# Patient Record
Sex: Female | Born: 1950 | Race: White | Hispanic: No | Marital: Married | Smoking: Never smoker
Health system: Southern US, Community
[De-identification: ages and names within clinical notes are randomized; demographics above are authoritative.]

## PROBLEM LIST (undated history)

## (undated) DIAGNOSIS — G20A1 Parkinson's disease without dyskinesia, without mention of fluctuations: Secondary | ICD-10-CM

## (undated) DIAGNOSIS — G20 Parkinson's disease: Secondary | ICD-10-CM

## (undated) HISTORY — PX: ABDOMINAL HYSTERECTOMY: SHX81

## (undated) HISTORY — PX: TONSILLECTOMY: SUR1361

## (undated) HISTORY — PX: BACK SURGERY: SHX140

---

## 2019-08-14 ENCOUNTER — Other Ambulatory Visit: Payer: Self-pay

## 2019-08-14 ENCOUNTER — Emergency Department (HOSPITAL_BASED_OUTPATIENT_CLINIC_OR_DEPARTMENT_OTHER): Payer: Medicare Other

## 2019-08-14 ENCOUNTER — Emergency Department (HOSPITAL_BASED_OUTPATIENT_CLINIC_OR_DEPARTMENT_OTHER)
Admission: EM | Admit: 2019-08-14 | Discharge: 2019-08-14 | Disposition: A | Discharge status: Home / Self Care | Payer: Medicare Other | Attending: Emergency Medicine | Admitting: Emergency Medicine

## 2019-08-14 ENCOUNTER — Encounter (HOSPITAL_BASED_OUTPATIENT_CLINIC_OR_DEPARTMENT_OTHER): Payer: Self-pay | Admitting: *Deleted

## 2019-08-14 DIAGNOSIS — Z79899 Other long term (current) drug therapy: Secondary | ICD-10-CM | POA: Diagnosis not present

## 2019-08-14 DIAGNOSIS — Y92828 Other wilderness area as the place of occurrence of the external cause: Secondary | ICD-10-CM | POA: Diagnosis not present

## 2019-08-14 DIAGNOSIS — W458XXA Other foreign body or object entering through skin, initial encounter: Secondary | ICD-10-CM | POA: Insufficient documentation

## 2019-08-14 DIAGNOSIS — Z23 Encounter for immunization: Secondary | ICD-10-CM | POA: Diagnosis not present

## 2019-08-14 DIAGNOSIS — S81811A Laceration without foreign body, right lower leg, initial encounter: Secondary | ICD-10-CM | POA: Insufficient documentation

## 2019-08-14 DIAGNOSIS — Y999 Unspecified external cause status: Secondary | ICD-10-CM | POA: Diagnosis not present

## 2019-08-14 DIAGNOSIS — Y9301 Activity, walking, marching and hiking: Secondary | ICD-10-CM | POA: Diagnosis not present

## 2019-08-14 DIAGNOSIS — G20 Parkinson's disease: Secondary | ICD-10-CM | POA: Diagnosis not present

## 2019-08-14 HISTORY — DX: Parkinson's disease: G20

## 2019-08-14 HISTORY — DX: Parkinson's disease without dyskinesia, without mention of fluctuations: G20.A1

## 2019-08-14 MED ORDER — FENTANYL CITRATE (PF) 100 MCG/2ML IJ SOLN
50.0000 ug | Freq: Once | INTRAMUSCULAR | Status: AC
  Administered 2019-08-14: 16:00:00 50 ug via INTRAVENOUS
  Filled 2019-08-14: qty 2

## 2019-08-14 MED ORDER — CEFAZOLIN SODIUM-DEXTROSE 1-4 GM/50ML-% IV SOLN
1.0000 g | Freq: Once | INTRAVENOUS | Status: DC
  Filled 2019-08-14: qty 50

## 2019-08-14 MED ORDER — SODIUM CHLORIDE 0.9 % IV SOLN
INTRAVENOUS | Status: DC | PRN
  Administered 2019-08-14: 250 mL via INTRAVENOUS

## 2019-08-14 MED ORDER — BUPIVACAINE HCL 0.5 % IJ SOLN
50.0000 mL | Freq: Once | INTRAMUSCULAR | Status: AC
  Administered 2019-08-14: 16:00:00 50 mL
  Filled 2019-08-14: qty 1

## 2019-08-14 MED ORDER — CEFAZOLIN SODIUM-DEXTROSE 2-4 GM/100ML-% IV SOLN
2.0000 g | Freq: Once | INTRAVENOUS | Status: AC
  Administered 2019-08-14: 17:00:00 2 g via INTRAVENOUS
  Filled 2019-08-14: qty 100

## 2019-08-14 MED ORDER — TETANUS-DIPHTH-ACELL PERTUSSIS 5-2.5-18.5 LF-MCG/0.5 IM SUSP
0.5000 mL | Freq: Once | INTRAMUSCULAR | Status: AC
  Administered 2019-08-14: 0.5 mL via INTRAMUSCULAR
  Filled 2019-08-14: qty 0.5

## 2019-08-14 MED ORDER — ONDANSETRON HCL 4 MG/2ML IJ SOLN
4.0000 mg | Freq: Once | INTRAMUSCULAR | Status: AC
  Administered 2019-08-14: 16:00:00 4 mg via INTRAVENOUS
  Filled 2019-08-14: qty 2

## 2019-08-14 NOTE — ED Provider Notes (Signed)
[name] Provider Note   CSN: [id number] Arrival date & time: 08/14/19  1344     History Chief Complaint  Patient presents with  . Laceration    Christina Pratt is a 69 y.o. female.  HPI      Christina Pratt is a 69 y.o. female, with a history of Parkinson's disease, presenting to the ED with right lower leg injury that occurred late morning today. Patient was walking in an area with freshly downed trees.  A cut off stick impaled her leg as she was walking.  She was seen at urgent care earlier today and then sent to the emergency department for further management. She is unsure of her last tetanus update.  Denies numbness, weakness, other injuries.  Past Medical History:  Diagnosis Date  . Parkinson disease ([place])     There are no problems to display for this patient.   Past Surgical History:  Procedure Laterality Date  . ABDOMINAL HYSTERECTOMY    . BACK SURGERY    . TONSILLECTOMY       OB History   No obstetric history on file.     No family history on file.  Social History   Tobacco Use  . Smoking status: Never Smoker  . Smokeless tobacco: Never Used  Substance Use Topics  . Alcohol use: Never  . Drug use: Never    Home Medications Prior to Admission medications   Medication Sig Start Date End Date Taking? Authorizing Provider  Carbidopa-Levodopa ER (SINEMET CR) 25-100 MG tablet controlled release Take by mouth. 05/02/19  Yes [provider]  clonazePAM (KLONOPIN) 0.5 MG tablet Take by mouth. 08/02/19  Yes [provider]  venlafaxine XR (EFFEXOR-XR) 75 MG 24 hr capsule Take by mouth. 08/02/19  Yes [provider]    Allergies    Patient has no known allergies.  Review of Systems   Review of Systems  Skin: Positive for wound.  Neurological: Negative for weakness and numbness.    Physical Exam Updated Vital Signs BP (!) 164/87   Pulse 80   Temp 98.2 F (36.8 C) (Oral)   Resp 18    Ht 5\' 5"  (1.651 m)   Wt 72.6 kg   SpO2 98%   BMI 26.63 kg/m   Physical Exam Vitals and nursing note reviewed.  Constitutional:      General: She is not in acute distress.    Appearance: She is well-developed. She is not diaphoretic.  HENT:     Head: Normocephalic and atraumatic.  Eyes:     Conjunctiva/sclera: Conjunctivae normal.  Cardiovascular:     Rate and Rhythm: Normal rate and regular rhythm.     Pulses:          Dorsalis pedis pulses are 2+ on the right side.       Posterior tibial pulses are 2+ on the right side.  Pulmonary:     Effort: Pulmonary effort is normal.  Musculoskeletal:     Cervical back: Neck supple.     Comments: 18 cm T-shaped laceration to the right lower leg, as shown. At depth, noted exposure of what appears to be fascia without noted violation. Full ROM in the patient's ankle and knee.  Skin:    General: Skin is warm and dry.     Capillary Refill: Capillary refill takes less than 2 seconds.     Coloration: Skin is not pale.  Neurological:     Mental Status: She is alert.  Comments: Sensation light touch grossly intact in the right lower extremity, including into the foot. Strength 5/5 in the right ankle and knee.  Psychiatric:        Behavior: Behavior normal.                      ED Results / Procedures / Treatments   Labs (all labs ordered are listed, but only abnormal results are displayed) Labs Reviewed - No data to display  EKG None  Radiology DG Tibia/Fibula Right  Result Date: 08/14/2019 CLINICAL DATA:  Laceration to lower leg EXAM: RIGHT TIBIA AND FIBULA - 2 VIEW COMPARISON:  None. FINDINGS: No fracture or malalignment. Large soft tissue laceration at the mid to distal lower leg without radiopaque foreign body. IMPRESSION: No acute osseous abnormality. Electronically Signed   By: Jasmine Pang M.D.   On: 08/14/2019 15:40    Procedures .Marland KitchenLaceration Repair  Date/Time: 08/14/2019 5:45 PM Performed by: Anselm Pancoast, PA-C Authorized by: Anselm Pancoast, PA-C   Consent:    Consent obtained:  Verbal   Consent given by:  Patient   Risks discussed:  Infection, need for additional repair, pain, poor cosmetic result, poor wound healing, retained foreign body, vascular damage, tendon damage and nerve damage Anesthesia (see MAR for exact dosages):    Anesthesia method:  Local infiltration   Local anesthetic:  Bupivacaine 0.5% w/o epi Laceration details:    Location:  Leg   Leg location:  R lower leg   Length (cm):  18 Repair type:    Repair type:  Complex Pre-procedure details:    Preparation:  Patient was prepped and draped in usual sterile fashion and imaging obtained to evaluate for foreign bodies Treatment:    Area cleansed with:  Betadine and saline   Amount of cleaning:  Extensive   Irrigation solution:  Sterile saline   Irrigation volume:  2L   Irrigation method:  Pressure wash   Debridement:  Minimal   Undermining:  None Skin repair:    Repair method:  Sutures   Suture size:  3-0   Suture material:  Prolene   Suture technique:  Simple interrupted   Number of sutures:  23 Approximation:    Approximation:  Loose Post-procedure details:    Dressing:  Non-adherent dressing, sterile dressing and bulky dressing   Patient tolerance of procedure:  Tolerated well, no immediate complications   (including critical care time)  Medications Ordered in ED Medications  Tdap (BOOSTRIX) injection 0.5 mL (0.5 mLs Intramuscular Given 08/14/19 1554)  fentaNYL (SUBLIMAZE) injection 50 mcg (50 mcg Intravenous Given 08/14/19 1551)  ondansetron (ZOFRAN) injection 4 mg (4 mg Intravenous Given 08/14/19 1551)  bupivacaine (MARCAINE) 0.5 % (with pres) injection 50 mL (50 mLs Infiltration Given 08/14/19 1606)  ceFAZolin (ANCEF) IVPB 2g/100 mL premix ( Intravenous Stopped 08/14/19 1758)    ED Course  I have reviewed the triage vital signs and the nursing notes.  Pertinent labs & imaging results that were available  during my care of the patient were reviewed by me and considered in my medical decision making (see chart for details).  Clinical Course as of Aug 14 1256  Mon Aug 14, 2019  1710 Spoke with Dr. Victorino Dike, orthopedic surgeon.  2 g Ancef here in ED. No PO ABX for home.  Have patient call the office tomorrow to set up an appointment to be seen on Wednesday.  If necessary, they will take the patient to the OR Thursday.   [  SJ]    Clinical Course User Index [SJ] Evora Schechter, Hillard Danker, PA-C   MDM Rules/Calculators/A&P                      Patient presents with large laceration to the right lower leg.  No noted evidence of neurovascular compromise. I personally reviewed and interpreted the patient's imaging study.  No noted osseous abnormalities or evidence of foreign bodies. Wound was thoroughly irrigated and loosely closed.  She will have close follow-up with orthopedics. The patient was given instructions for home care as well as return precautions. Patient voices understanding of these instructions, accepts the plan, and is comfortable with discharge.    Findings and plan of care discussed with Cathi Roan, MD. Dr. Jacqulyn Bath and I reviewed the pictures together.   Final Clinical Impression(s) / ED Diagnoses Final diagnoses:  Laceration of right lower extremity, initial encounter    Rx / DC Orders ED Discharge Orders    None     Bupivacaine calculation: 2.5 mg/kg max dose.  180 mg max.  5 mg/mL concentration was used.  Max volume allowed 36 mL.   Anselm Pancoast, PA-C 08/15/19 1303    Maia Plan, MD 08/15/19 1330

## 2019-08-14 NOTE — Discharge Instructions (Addendum)
  Wound Care - Laceration Remove the bandage when soiled or wet.  May replace the bandage if the wound continues to ooze.  Clean the wound and surrounding area gently with tap water and mild soap. Rinse well and blot dry. Do not scrub the wound, as this may cause the wound edges to come apart. You may shower, but avoid submerging the wound, such as with a bath or swimming. Clean the wound daily to prevent infection. Do not use cleaners such as hydrogen peroxide or alcohol.   Scar reduction: Application of a topical antibiotic ointment, such as Neosporin, after the wound has begun to close and heal well can decrease scab formation and reduce scarring. After the wound has healed and wound closures have been removed, application of ointments such as Aquaphor can also reduce scar formation.  The key to scar reduction is keeping the skin well hydrated and supple. Drinking plenty of water throughout the day (At least eight 8oz glasses of water a day) is essential to staying well hydrated.  Sun exposure: Keep the wound out of the sun. After the wound has healed, continue to protect it from the sun by wearing protective clothing or applying sunscreen.  Pain: You may use Tylenol, naproxen, or ibuprofen for pain.  Suture/staple removal: As ordered by the orthopedic specialist.  Follow-up: Call the orthopedic surgeon's office tomorrow to set up an appointment to be seen on Wednesday, May 5.  Return to the ED sooner should the wound edges come apart or signs of infection arise, such as spreading redness, puffiness/swelling, pus draining from the wound, severe increase in pain, fever over 100.2F, or any other major issues.  For prescription assistance, may try using prescription discount sites or apps, such as goodrx.com

## 2019-08-14 NOTE — ED Triage Notes (Signed)
Laceration to her right lower leg. She ran into a piece of wood.

## 2019-08-15 ENCOUNTER — Telehealth (HOSPITAL_BASED_OUTPATIENT_CLINIC_OR_DEPARTMENT_OTHER): Payer: Self-pay | Admitting: Emergency Medicine

## 2019-08-15 NOTE — ED Provider Notes (Signed)
1:01 PM Was notified that the patient had called Dr. Laverta Baltimore office to set up the follow-up appointment, as instructed, and was told they did not have availability until June.  1:12 PM spoke with Gaylyn Rong with [facility].  She has scheduled the patient for an appointment tomorrow morning, May 5 at 8:45 AM. I will call the patient and notify her.   Anselm Pancoast, PA-C 08/15/19 1313    Maia Plan, MD 08/15/19 1330

## 2019-08-15 NOTE — Telephone Encounter (Cosign Needed)
1:15 PM attempted to call the patient at the number listed.  Went directly to [employer].  Left a message with the appointment date and time I set up for the patient.  I also left a callback number for her to reach me should she need to do so.

## 2019-08-15 NOTE — Telephone Encounter (Cosign Needed)
1:27 PM Patient called back and I was able to communicate the appointment time for tomorrow with Dr. Victorino Dike at 8:45 AM.  She acknowledges this appointment time and repeats it back. She states she has had minimal pain in her leg wound.  The bandage has stayed dry without bleeding or seepage through the bandage.

## 2019-08-24 ENCOUNTER — Ambulatory Visit: Payer: Self-pay

## 2019-08-24 NOTE — Telephone Encounter (Signed)
Attempted to contact patient. She has question about stitches. Left VM for her to return call to [place] @ [phone].

## 2019-08-24 NOTE — Telephone Encounter (Signed)
Returned call to patient.  She states that she was in the ER and received 23 sutures to her rt leg 08/14/19. They are on the side and between her ankle and knee.  She was asked to follow up with her PCP. She states that her PCP can not see her until next Friday and she is fearful that will be too long. Patient was advised that she should follow recommendation per her discharge instructions. She states that she can not find anything on the instructions about the approximate date that they should be removed. She was advised to contact her PCP for more advice or go to UC or clinic for evaluation of the area. She verbalized understanding and will contact her PCP again for more advice. Care advice read to patient. She verbalized understanding. Reason for Disposition . Suture (or staple) removal date, questions about  Answer Assessment - Initial Assessment Questions 1. LOCATION: "Where are the sutures (or staples) located?"      Side of rt leg 2. NUMBER: "How many sutures (or staples) are there?"      23 3. DATE IN: "When were the sutures (or staples) put in?"       08/14/19 4. DATE OUT: "When did your doctor tell you the sutures (or staples) needed to come out?"      5. OTHER SYMPTOMS: "Do you have any other symptoms?" (e.g., wound pain, discharge, fever?)     No problem 6. PREGNANCY: "Is there any chance you are pregnant?" "When was your last menstrual period?"     N/A  Protocols used: SUTURE OR STAPLE QUESTIONS-A-AH

## 2019-08-31 ENCOUNTER — Ambulatory Visit (INDEPENDENT_AMBULATORY_CARE_PROVIDER_SITE_OTHER): Payer: Medicare Other | Admitting: Plastic Surgery

## 2019-08-31 ENCOUNTER — Other Ambulatory Visit: Payer: Self-pay

## 2019-08-31 DIAGNOSIS — S81801A Unspecified open wound, right lower leg, initial encounter: Secondary | ICD-10-CM

## 2019-08-31 NOTE — Progress Notes (Signed)
   Referring Provider Real Cons Dionne, PA [address] [place],  [place] [address]   CC:  Chief Complaint  Patient presents with  . Consult    laceration of the right lower leg ED visit 08/14/19      Christina Pratt is an 69 y.o. female.  HPI: Patient presents as follow-up from the emergency room for a wound on her right lower leg.  She was walking in a stick from a blush of also skin off of her anterior right lower leg.  She was seen in the emergency room and some sutures were applied.  She has been doing wound care since then.  There is some concern over the viability of the skin so she was sent to me for my opinion.  Otherwise she is doing well and is walking around and has no deeper orthopedic injury.  No Known Allergies  Outpatient Encounter Medications as of 08/31/2019  Medication Sig  . Carbidopa-Levodopa ER (SINEMET CR) 25-100 MG tablet controlled release Take by mouth.  . clonazePAM (KLONOPIN) 0.5 MG tablet Take by mouth.  . venlafaxine XR (EFFEXOR-XR) 75 MG 24 hr capsule Take by mouth.   No facility-administered encounter medications on file as of 08/31/2019.     Past Medical History:  Diagnosis Date  . Parkinson disease [facility])     Past Surgical History:  Procedure Laterality Date  . ABDOMINAL HYSTERECTOMY    . BACK SURGERY    . TONSILLECTOMY      No family history on file.  Social History   Social History Narrative  . Not on file     Review of Systems General: Denies fevers, chills, weight loss CV: Denies chest pain, shortness of breath, palpitations  Physical Exam Vitals with BMI 08/14/2019 08/14/2019 08/14/2019  Height - - -  Weight - - -  BMI - - -  Systolic 126 141 193  Diastolic 65 73 91  Pulse - - 75    General:  No acute distress,  Alert and oriented, Non-Toxic, Normal speech and affect Right leg: She has an avulsion injury to the skin over the anterolateral aspect of the lower leg at the junction between the distal and middle  third.  There is a black eschar suggestive of necrotic skin.  There is several Prolene sutures in place that were all removed as they were not serving any purpose at this point.  There is no erythema or purulence suggestive of infection.  Assessment/Plan Patient has a acute traumatic wound to the right leg.  I did perform a debridement today using pickups and scissors and excised necrotic skin over an area approximately 8 x 8 cm.  She tolerated this well.  She still has some necrotic subcutaneous tissue deep to that but no exposure of muscle or tendon.  I did discuss operative intervention with her but she is opposed to any type of surgery at this point.  Given that I recommended Santyl for enzymatic debridement.  We will do this for the next 2 weeks and then I will have her follow-up with Dr. Mikey Bussing for further nonsurgical management of her wound.  She knows if she has any questions in the meantime that she can call us.  Christina Pratt 08/31/2019, 12:37 PM

## 2019-09-01 ENCOUNTER — Telehealth: Payer: Self-pay

## 2019-09-01 ENCOUNTER — Other Ambulatory Visit (INDEPENDENT_AMBULATORY_CARE_PROVIDER_SITE_OTHER): Payer: Medicare Other | Admitting: Plastic Surgery

## 2019-09-01 DIAGNOSIS — S81801A Unspecified open wound, right lower leg, initial encounter: Secondary | ICD-10-CM

## 2019-09-01 MED ORDER — SANTYL 250 UNIT/GM EX OINT
1.0000 | TOPICAL_OINTMENT | Freq: Every day | CUTANEOUS | 0 refills | Status: AC
Start: 2019-09-01 — End: ?

## 2019-09-01 NOTE — Telephone Encounter (Cosign Needed)
Sent in RX to pharmacy for Santyl per Dr. Kennon Portela last visit note.

## 2019-09-01 NOTE — Telephone Encounter (Signed)
Patient called to state her preferred pharmacy is: [facility]. [address] [place], [place] [address]

## 2019-09-01 NOTE — Telephone Encounter (Signed)
Christina Pratt called to follow up on prescription that was supposed to be called into [facility] in [facility]. Please call her to advise when it's been called in.

## 2019-09-01 NOTE — Telephone Encounter (Signed)
error 

## 2019-09-18 ENCOUNTER — Other Ambulatory Visit: Payer: Self-pay

## 2019-09-18 ENCOUNTER — Encounter: Payer: Self-pay | Admitting: Internal Medicine

## 2019-09-18 ENCOUNTER — Ambulatory Visit (INDEPENDENT_AMBULATORY_CARE_PROVIDER_SITE_OTHER): Payer: Medicare Other | Admitting: Internal Medicine

## 2019-09-18 VITALS — BP 116/74 | HR 80 | Temp 98.0°F | Ht 65.0 in | Wt 169.0 lb

## 2019-09-18 DIAGNOSIS — S81801D Unspecified open wound, right lower leg, subsequent encounter: Secondary | ICD-10-CM

## 2019-09-18 NOTE — Progress Notes (Signed)
   Subjective:     Patient ID: Christina Pratt, female    DOB: 01-11-1951, 69 y.o.   MRN: [id number]  Chief Complaint  Patient presents with  . Advice Only    for (R) leg wound    HPI: The patient is a 69 y.o. female here for traumatic wound of right lower leg.  Patient was referred to our office by Dr. Steffanie Dunn pace, plastic surgery for wound care.   Patient states that about 1 month ago she was walking and a branch on the ground cut into her right leg.  She has been using Santyl for the past 2 weeks with daily dressing changes.  She denies acute pain, purulent drainage or increased warmth or redness to the area.  Review of Systems  All other systems reviewed and are negative.    has a past medical history of Parkinson disease ([facility]).  has a past surgical history that includes Back surgery; Abdominal hysterectomy; and Tonsillectomy.  reports that she has never smoked. She has never used smokeless tobacco. Objective:   Vital Signs BP 116/74 (BP Location: Left Arm, Patient Position: Sitting, Cuff Size: Normal)   Pulse 80   Temp 98 F (36.7 C) (Temporal)   Ht 5\' 5"  (1.651 m)   Wt 169 lb (76.7 kg)   SpO2 93%   BMI 28.12 kg/m  Vital Signs and Nursing Note Reviewed  Physical Exam  Skin:  Right leg wound measures: 6.5 x 6.0 x 0.5 cm Granulation tissue throughout Some areas of sloughing noted        Assessment/Plan:     ICD-10-CM   1. Wound of right lower extremity, subsequent encounter  S81.801D    Assessment: Traumatic right lower extremity wound due to tree branch  Patient visited the ED on 5/3.  Pictures of the visit were reviewed.  It appears that the wound was deep to the tendon at that time.  She was recently seen by Dr. 7/3 2 weeks ago and was started on Santyl.  Today the wound appears well-healing with no signs of infection.  There was sloughing noted and this was debrided in office with curette, gauze and wound cleanser.  I think patient would benefit from  collagen dressing changes in addition to santyl ointment daily.  Plan -In office sharp debridement with currette, gauze and cleanser  -Santyl daily -Collagen daily -Follow-up in 2 weeks   Arita Miss, DO 09/18/2019, 2:17 PM

## 2019-09-18 NOTE — Patient Instructions (Signed)
Christina Pratt It was a pleasure meeting you today.  Please follow the instructions below for your wound care  Please change daily 1) Place santyl (collagenase) ointment on first 2) Followed by collagen dressing  3) followed by non stick pad 4) wrap with gauze  When you take the dressing off please remove any residue with guaze and normal saline   Please follow up with me in 2 weeks.  Call us at [phone] with any questions or concerns  [employer] is a medical supply company.  We have sent them an order for your supplies.  Please contact them if you do not receive your supplies in the next 72hrs.  Their number is [phone] and website is [email or web address]

## 2019-09-19 ENCOUNTER — Telehealth: Payer: Self-pay | Admitting: *Deleted

## 2019-09-19 NOTE — Telephone Encounter (Signed)
Faxed order to [facility] for supplies for the patient.  Confirmation received and copy scanned into the chart.//AB/CMA 

## 2019-10-02 ENCOUNTER — Encounter: Payer: Self-pay | Admitting: Internal Medicine

## 2019-10-02 ENCOUNTER — Other Ambulatory Visit: Payer: Self-pay

## 2019-10-02 ENCOUNTER — Ambulatory Visit: Payer: Medicare Other | Admitting: Internal Medicine

## 2019-10-02 VITALS — BP 126/73 | HR 81 | Temp 97.5°F | Ht 65.0 in | Wt 169.0 lb

## 2019-10-02 DIAGNOSIS — S81801D Unspecified open wound, right lower leg, subsequent encounter: Secondary | ICD-10-CM | POA: Diagnosis not present

## 2019-10-02 NOTE — Patient Instructions (Signed)
Christina Pratt It was a pleasure seeing you today.  Please follow the instructions below for your wound care.  Please change the dressing once a day.  1) santyl ointment on the wound  2) followed by collagen  2) followed by non stick pad 3) wrap with gauze 4) cover with ace bandage as needed  Please follow up with me in 3 weeks.  Call us at [phone] with any questions or concerns  If you run out of santyl please continue dressing changes with just collagen  [employer] is a medical supply company that sent you your wound care supplies.  If you need more supplies, their number is [phone] and website is [email or web address]  Thanks, Dr. Mikey Bussing

## 2019-10-02 NOTE — Progress Notes (Signed)
   Subjective:     Patient ID: Christina Pratt, female    DOB: 1951-02-01, 69 y.o.   MRN: [id number]  Chief Complaint  Patient presents with  . Follow-up    HPI: The patient is a 69 y.o. female here for follow-up of traumatic wound to the right lower leg.  Patient has been using santyl and collagen daily with dressing changes for the past 2 weeks.  She reports improvement in the wound appearance and size.  She reports moderate drainage.  She asked if she could get in the pool.  She denies acute pain, purulent drainage, increased erythema or warmth to the wound.  Review of Systems  All other systems reviewed and are negative.    has a past medical history of Parkinson disease ([facility]).  has a past surgical history that includes Back surgery; Abdominal hysterectomy; and Tonsillectomy.  reports that she has never smoked. She has never used smokeless tobacco. Objective:   Vital Signs BP 126/73 (BP Location: Left Arm, Patient Position: Sitting, Cuff Size: Large)   Pulse 81   Temp (!) 97.5 F (36.4 C) (Temporal)   Ht 5\' 5"  (1.651 m)   Wt 169 lb (76.7 kg)   SpO2 92%   BMI 28.12 kg/m  Vital Signs and Nursing Note Reviewed Physical Exam Skin:    Comments: Right leg wound measures: 5.5 x 5.0 x 0.3 cm Granulation tissue throughout Some areas of sloughing noted           Assessment/Plan:     ICD-10-CM   1. Wound of right lower extremity, subsequent encounter  S81.801D    Assessment: Traumatic right lower extremity wound due to tree branch  The wound continues to heal nicely with the use of santyl and collagen dressing changes daily.  No signs of infection.  Areas of sloughing noted and this was debrided in office with curette, gauze and wound cleanser.  I recommended continuing with daily santyl and collagen dressing changes.  Once she runs out of santyl can continue with collagen alone.  I recommended she stay out of the pool for now while the wound heals.  Plan -in office  debridement with curette, gauze and wound cleanser -in office dressing change with santyl, collagen, non stick pad and kerlix -continue dressing changes with santyl and collagen daily -follow up in 3 weeks     , DO 10/02/2019, 2:24 PM

## 2019-10-23 ENCOUNTER — Ambulatory Visit: Payer: Medicare Other | Admitting: Internal Medicine

## 2019-10-26 ENCOUNTER — Ambulatory Visit (INDEPENDENT_AMBULATORY_CARE_PROVIDER_SITE_OTHER): Payer: Medicare Other | Admitting: Internal Medicine

## 2019-10-26 ENCOUNTER — Other Ambulatory Visit: Payer: Self-pay

## 2019-10-26 ENCOUNTER — Encounter: Payer: Self-pay | Admitting: Internal Medicine

## 2019-10-26 VITALS — BP 132/67 | HR 77 | Temp 98.1°F

## 2019-10-26 DIAGNOSIS — S81801D Unspecified open wound, right lower leg, subsequent encounter: Secondary | ICD-10-CM

## 2019-10-26 NOTE — Progress Notes (Signed)
   Subjective:     Patient ID: Christina Pratt, female    DOB: 12/14/1950, 69 y.o.   MRN: [id number]  Chief Complaint  Patient presents with  . Follow-up of right lower leg wound    HPI: The patient is a 69 y.o. female here for follow-up of traumatic wound to the right lower leg.  Patient continues to use santyl and collagen daily with dressing changes.  She continues to see improvement in wound healing.  She denies acute pain, purulent drainage, increased warmth or erythema to the wound.  She has no complaints today.  She asked if she could go into the pool.  Review of Systems  All other systems reviewed and are negative.    has a past medical history of Parkinson disease ([facility]).  has a past surgical history that includes Back surgery; Abdominal hysterectomy; and Tonsillectomy.  reports that she has never smoked. She has never used smokeless tobacco. Objective:   Vital Signs BP 132/67 (BP Location: Left Arm, Patient Position: Sitting, Cuff Size: Large)   Pulse 77   Temp 98.1 F (36.7 C) (Oral)   SpO2 95%  Vital Signs and Nursing Note Reviewed Physical Exam Skin:    Comments: Right leg wound measures: 4.5 x 4.0  x 0.1 cm Granulation tissue throughout Some areas of sloughing noted           Assessment/Plan:     ICD-10-CM   1. Wound of right lower extremity, subsequent encounter  S81.801D    Assessment: Traumatic right lower extremity wound due to tree branch  It has been a little over 2 months since the original laceration wound.  Since then the wound has healed well.  Today the wound has decreased in size since last clinic visit.  There is good granulation tissue in the wound bed with no signs of infection.  I recommended continuing Santyl with collagen dressing changes daily.  She can continue with collagen dressing changes alone once she is done with Santyl.  I recommended continuing to stay out of the pool since the wound is opened.  I would like to see her back in 3  weeks.  Plan -Wound cleaned with wound cleanser and gauze, Mepilex border foam dressing applied in office - Santyl and collagen dressings daily  -Follow-up in 3 weeks  Aldean Baker, DO 10/26/2019, 1:43 PM

## 2019-11-20 ENCOUNTER — Encounter: Payer: Self-pay | Admitting: Internal Medicine

## 2019-11-20 ENCOUNTER — Ambulatory Visit: Payer: Medicare Other | Admitting: Internal Medicine

## 2019-11-20 ENCOUNTER — Other Ambulatory Visit: Payer: Self-pay

## 2019-11-20 VITALS — BP 93/61 | HR 82

## 2019-11-20 DIAGNOSIS — S81802A Unspecified open wound, left lower leg, initial encounter: Secondary | ICD-10-CM | POA: Diagnosis not present

## 2019-11-20 DIAGNOSIS — S81801D Unspecified open wound, right lower leg, subsequent encounter: Secondary | ICD-10-CM

## 2019-11-20 NOTE — Progress Notes (Signed)
   Subjective:     Patient ID: Christina Pratt, female    DOB: 1950/04/24, 68 y.o.   MRN: [id number]  Chief Complaint  Patient presents with  . Follow-up of traumatic wound to the right lower leg, now new left lower leg wound    HPI: The patient is a 69 y.o. female here for follow-up of traumatic wound to the right lower leg and acute left lower leg wound.  For the right chronic leg wound patient states she has continued to use collagen daily.  She has run out of Santyl and would like to hold off using this product due to cost.  She reports improvement in the size and appearance of the wound.  She denies drainage, increased warmth or erythema to the area.  She has now developed a new wound to the left lower leg.  This occurred 2 days ago when she was standing from a seated position from her wheelchair and her leg got caught on the plastic foot support.  She has kept the wound covered since the event.  Review of Systems  All other systems reviewed and are negative.    has a past medical history of Parkinson disease (HCC).  has a past surgical history that includes Back surgery; Abdominal hysterectomy; and Tonsillectomy.  reports that she has never smoked. She has never used smokeless tobacco. Objective:   Vital Signs BP 93/61 (BP Location: Left Arm, Patient Position: Sitting, Cuff Size: Large)   Pulse 82   SpO2 95%  Vital Signs and Nursing Note Reviewed Physical Exam Skin:    Comments: Right lower extremity wound measures: 3.0 x 3.0 x 0.1 cm This wound has scabbed over  Left lower extremity wound measures: 3.0 x 4.0 x 0.5 cm Devitalized tissue in the wound bed Granulation tissue present           Assessment/Plan:     ICD-10-CM   1. Wound of right lower extremity, subsequent encounter  S81.801D   2. 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  There are no signs of infection today.  At this time patient would like to not continue with Santyl.  She is currently using collagen.  I recommended she start silver collagen daily with dressing changes.   The left lower extremity wound is new.  No signs of infection today.  There is devitalized tissue in the wound bed and this was debrided with pickups scissors and gauze.  I also recommended using silver collagen daily with dressing changes to this wound.  Supplies will be sent through [facility].  Plan -In office sharp debridement -Wounds cleaned and dressed with Vaseline and Band-Aid -Silver collagen daily with dressing changes -Follow-up in 1 week  Aldean Baker, DO 11/20/2019, 1:24 PM

## 2019-11-20 NOTE — Patient Instructions (Signed)
Christina Pratt It was a pleasure seeing you today.  Please follow the instructions below for your wound care  1) collagen dressing changes daily 2) Cover with bandaid   Please follow up with me in 1 weeks.  Call us at [phone] with any questions or concerns  [facility] is a medical supply company.  We have sent them an order for your supplies.  Please contact them if you do not receive your supplies in the next 72hrs.  Their number is [phone] and website is [email or web address]

## 2019-11-23 ENCOUNTER — Telehealth: Payer: Self-pay | Admitting: *Deleted

## 2019-11-23 NOTE — Telephone Encounter (Signed)
Faxed order on (11/21/19) to [facility] for medical supplies for the patient.  Confirmation received and copy scanned into the chart.//AB/CMA 

## 2019-11-23 NOTE — Telephone Encounter (Signed)
On (11/21/19) received Order Status Notification from [facility].  Stating:[facility] has provided service for the patient; no further action is required.//AB/CMA

## 2019-11-27 ENCOUNTER — Other Ambulatory Visit: Payer: Self-pay

## 2019-11-27 ENCOUNTER — Ambulatory Visit: Payer: Medicare Other | Admitting: Internal Medicine

## 2019-11-27 VITALS — BP 112/66 | HR 80 | Temp 98.6°F

## 2019-11-27 DIAGNOSIS — S81802D Unspecified open wound, left lower leg, subsequent encounter: Secondary | ICD-10-CM

## 2019-11-27 DIAGNOSIS — S81801D Unspecified open wound, right lower leg, subsequent encounter: Secondary | ICD-10-CM

## 2019-11-27 NOTE — Progress Notes (Signed)
   Subjective:     Patient ID: Christina Pratt, female    DOB: May 31, 1950, 69 y.o.   MRN: [id number]  Chief Complaint  Patient presents with  . Follow-up on bilateral leg wounds    HPI: The patient is a 69 y.o. female here for follow-up of traumatic bilateral leg wounds.  Her chronic right leg wound continues to improve.  She is currently using Vaseline and keeping the area covered.  For her left acute leg wound she is also using Vaseline and keeping it covered with a Band-Aid.  She reports improvement to the wounds in appearance and size.  She was seen on 8/10 by orthopedic and was diagnosed with a right nondisplaced fracture of the medial condyle.  She is currently in a leg brace.  Review of Systems  All other systems reviewed and are negative.    has a past medical history of Parkinson disease (HCC).  has a past surgical history that includes Back surgery; Abdominal hysterectomy; and Tonsillectomy.  reports that she has never smoked. She has never used smokeless tobacco. Objective:   Vital Signs BP 112/66 (BP Location: Left Arm, Patient Position: Sitting, Cuff Size: Normal)   Pulse 80   Temp 98.6 F (37 C) (Oral)   SpO2 96%  Vital Signs and Nursing Note Reviewed Physical Exam Skin:    Comments: Right lower extremity wound measures: 2.5 x 2.7 x 0.1, granulation tissue throughout the wound bed.  No increased erythema or warmth to the wound  Left lower extremity wound measures: 3.0 x 3.0 x 0.3.  Devitalized tissue in the wound bed with granulation tissue.  No increased erythema or warmth to the wound            Assessment/Plan:     ICD-10-CM   1. Wound of right lower extremity, subsequent encounter  S81.801D   2. 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   No signs of infection today.  I recommended she continue to use Vaseline and a Band-Aid over the right lower extremity.  For the left lower extremity wound I recommended a Tegaderm silicone foam border.  She can keep this in place for 7 days.  I will see her at follow-up next week.  If the foam dressing comes off she can use Vaseline and keep it covered in the meantime.  Plan -wounds cleaned and dressed with Vaseline and Band-Aid -Tegaderm silicone foam dressing to the left lower extremity -Follow-up next week  Aldean Baker, DO 11/27/2019, 1:13 PM

## 2019-12-04 ENCOUNTER — Encounter: Payer: Self-pay | Admitting: Internal Medicine

## 2019-12-04 ENCOUNTER — Ambulatory Visit (INDEPENDENT_AMBULATORY_CARE_PROVIDER_SITE_OTHER): Payer: Medicare Other | Admitting: Internal Medicine

## 2019-12-04 ENCOUNTER — Other Ambulatory Visit: Payer: Self-pay

## 2019-12-04 VITALS — BP 97/62 | HR 79 | Temp 98.2°F

## 2019-12-04 DIAGNOSIS — S81801D Unspecified open wound, right lower leg, subsequent encounter: Secondary | ICD-10-CM | POA: Diagnosis not present

## 2019-12-04 NOTE — Progress Notes (Addendum)
   Subjective:     Patient ID: Christina Pratt, female    DOB: [birth year], 69 y.o.   MRN: [id number]  Chief Complaint  Patient presents with  . Follow-up    HPI: The patient is a 69 y.o. female here for follow-up of traumatic bilateral leg wounds.  Patient states that her chronic right leg wound continues to improve.  She continues to use Vaseline and keeping the area covered.  For the left leg wound she has kept the dressing in place placed from last clinic visit.  She denies any pain, drainage or increased warmth or erythema to the leg.  Review of Systems  All other systems reviewed and are negative.    has a past medical history of Parkinson disease (HCC).  has a past surgical history that includes Back surgery; Abdominal hysterectomy; and Tonsillectomy.  reports that she has never smoked. She has never used smokeless tobacco. Objective:   Vital Signs BP 97/62 (BP Location: Left Arm, Patient Position: Sitting, Cuff Size: Normal)   Pulse 79   Temp 98.2 F (36.8 C) (Oral)   SpO2 95%  Vital Signs and Nursing Note Reviewed Physical Exam Skin:    Comments: Right lower extremity wound with scab  Left lower extremity wound measures: 3.0 x 3.0 x 0.3.  Devitalized tissue in the wound bed with granulation tissue.  No increased warmth to the wound            Assessment/Plan:     ICD-10-CM   1. 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  This was taken off today.  The wound bed looks slightly improved although there is devitalized tissue present.  This was debrided with wound cleanser and gauze.  The periwound is red although I do not think this is infected.  I think this is irritation from the dressing.  I asked her to use bacitracin ointment  on the wound for the next day or 2 and start using silver collagen dressing to the wound bed.  Plan -Wound cleaned and dressed with Vaseline, nonstick pad and rolled gauze -Use silver collagen daily with dressing changes -Follow-up next week  Aldean Baker, DO 12/04/2019, 5:37 PM

## 2019-12-07 ENCOUNTER — Telehealth: Payer: Self-pay | Admitting: *Deleted

## 2019-12-07 NOTE — Telephone Encounter (Signed)
Faxed orders to [facility] for medical supplies for the patient.  Confirmation received.  Supplies:ABD pad                Roll gauze                Tape                Silver Collagen     Saline  Daily use.//AB/CMA

## 2019-12-11 ENCOUNTER — Encounter: Payer: Self-pay | Admitting: Internal Medicine

## 2019-12-11 ENCOUNTER — Ambulatory Visit: Payer: Medicare Other | Admitting: Internal Medicine

## 2019-12-11 ENCOUNTER — Other Ambulatory Visit: Payer: Self-pay

## 2019-12-11 VITALS — BP 109/63 | HR 84 | Temp 98.3°F

## 2019-12-11 DIAGNOSIS — S81802D Unspecified open wound, left lower leg, subsequent encounter: Secondary | ICD-10-CM | POA: Diagnosis not present

## 2019-12-11 DIAGNOSIS — S81801D Unspecified open wound, right lower leg, subsequent encounter: Secondary | ICD-10-CM

## 2019-12-11 NOTE — Patient Instructions (Signed)
Christina Pratt It was a pleasure seeing you today.  Please follow the instructions below for your wound care  For the right leg wound 1) vaseline dressing changes daily 2) followed by bandaid  For the left leg wound 1) Place silver collagen on wound daily 2) followed by a Band-Aid or nonstick pad with rolled gauze  Please take off the bandage placed today on Thursday morning  Please follow up with me in 1 week.  Call us at [phone] with any questions or concerns

## 2019-12-11 NOTE — Progress Notes (Signed)
   Subjective:     Patient ID: Christina Pratt, female    DOB: December 17, 1950, 69 y.o.   MRN: [id number]  Chief Complaint  Patient presents with  . Follow-up    HPI: The patient is a 69 y.o. female here for follow-up of traumatic bilateral leg wounds.  Patient states she has been using Vaseline with a Band-Aid to her right lower extremity wound daily.  She states she received her supplies of collagen and has started daily dressing changes of collagen and Band-Aid to the left lower extremity wound.  She reports improvement in appearance of the wounds.  She denies purulent drainage, erythema or increased warmth to the wound sites.  Review of Systems  All other systems reviewed and are negative.    has a past medical history of Parkinson disease (HCC).  has a past surgical history that includes Back surgery; Abdominal hysterectomy; and Tonsillectomy.  reports that she has never smoked. She has never used smokeless tobacco. Objective:   Vital Signs BP 109/63 (BP Location: Left Arm, Patient Position: Sitting, Cuff Size: Normal)   Pulse 84   Temp 98.3 F (36.8 C) (Oral)   SpO2 95%  Vital Signs and Nursing Note Reviewed Physical Exam Skin:    Comments: Left lower extremity wound measures: 2.5 x 2.5 x 0.2 cm Right lower extremity wound measures: 2.0 x 2.0 x 0.1 cm  Devitalized tissue noted in the left lower extremity wound No swelling, drainage, increased warmth or erythema noted to either wound site     Assessment/Plan:     ICD-10-CM   1. Wound of right lower extremity, subsequent encounter  S81.801D   2. 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  The periwound to the left lower extremity has improved significantly.  I recommended she continue to use  silver collagen to the left lower extremity wound daily with dressing changes and Vaseline to the right lower extremity wound with daily dressing changes.  Plan -Wound cleaned and dressed with Mepilex border dressing to both wound sites, can remove dressing Thursday - Use silver collagen daily with dressing changes to the left leg -Vaseline daily with dressing changes to the right leg -Follow-up next week  Aldean Baker, DO 12/11/2019, 1:53 PM

## 2019-12-20 ENCOUNTER — Ambulatory Visit: Payer: Medicare Other | Admitting: Internal Medicine

## 2020-01-29 ENCOUNTER — Telehealth: Payer: Self-pay | Admitting: *Deleted

## 2020-01-29 NOTE — Telephone Encounter (Signed)
Patient called to let us know that she received 3 boxes of medical supplies from [facility], and they want her to pay for them.  She says she doesn't need them.  Patient wanted to know if we orders them for her.

## 2020-02-12 NOTE — Telephone Encounter (Signed)
Called and spoke with the patient on (11/17/19) regarding the message below.  Informed the patient that we had not ordered any other supplies for her since (12/07/19).    Explained to the patient the reason she received 3 boxes of supplies was because we changed the supplies that were being used.  Patient verbalized understanding and stated that she did not use the supplies.    Informed the patient that they will look for her to pay for the supplies because the supplies where shipped to her and she received them.  But informed her that they will not be shipping any more.  Patient verbalized understanding and agreed.  Asked the patient how the wound area was doing.  She stated that there is no drainage, and the area do not look infected.  She stated the area is doing good.   Patient stated that she had (R) knee joint replacement the 1st week of Sept at [facility],and the area got infected.    Patient stated that she will be following up with her PCP and she will let her PCP look at the wound area to see if she still need wound care.//AB/CMA

## 2020-06-24 ENCOUNTER — Ambulatory Visit: Admit: 2020-06-24 | Payer: Medicare Other | Admitting: Orthopedic Surgery

## 2020-06-24 SURGERY — ARTHROPLASTY, SHOULDER, TOTAL, REVERSE
Anesthesia: General | Site: Shoulder | Laterality: Left

## 2021-10-19 IMAGING — DX DG TIBIA/FIBULA 2V*R*
2 series · 2 of 2 positions shown · non-contrast
Comparison: None.

CLINICAL DATA: Laceration to lower leg

EXAM:
RIGHT TIBIA AND FIBULA - 2 VIEW

[tibia ap (1 of 2)]
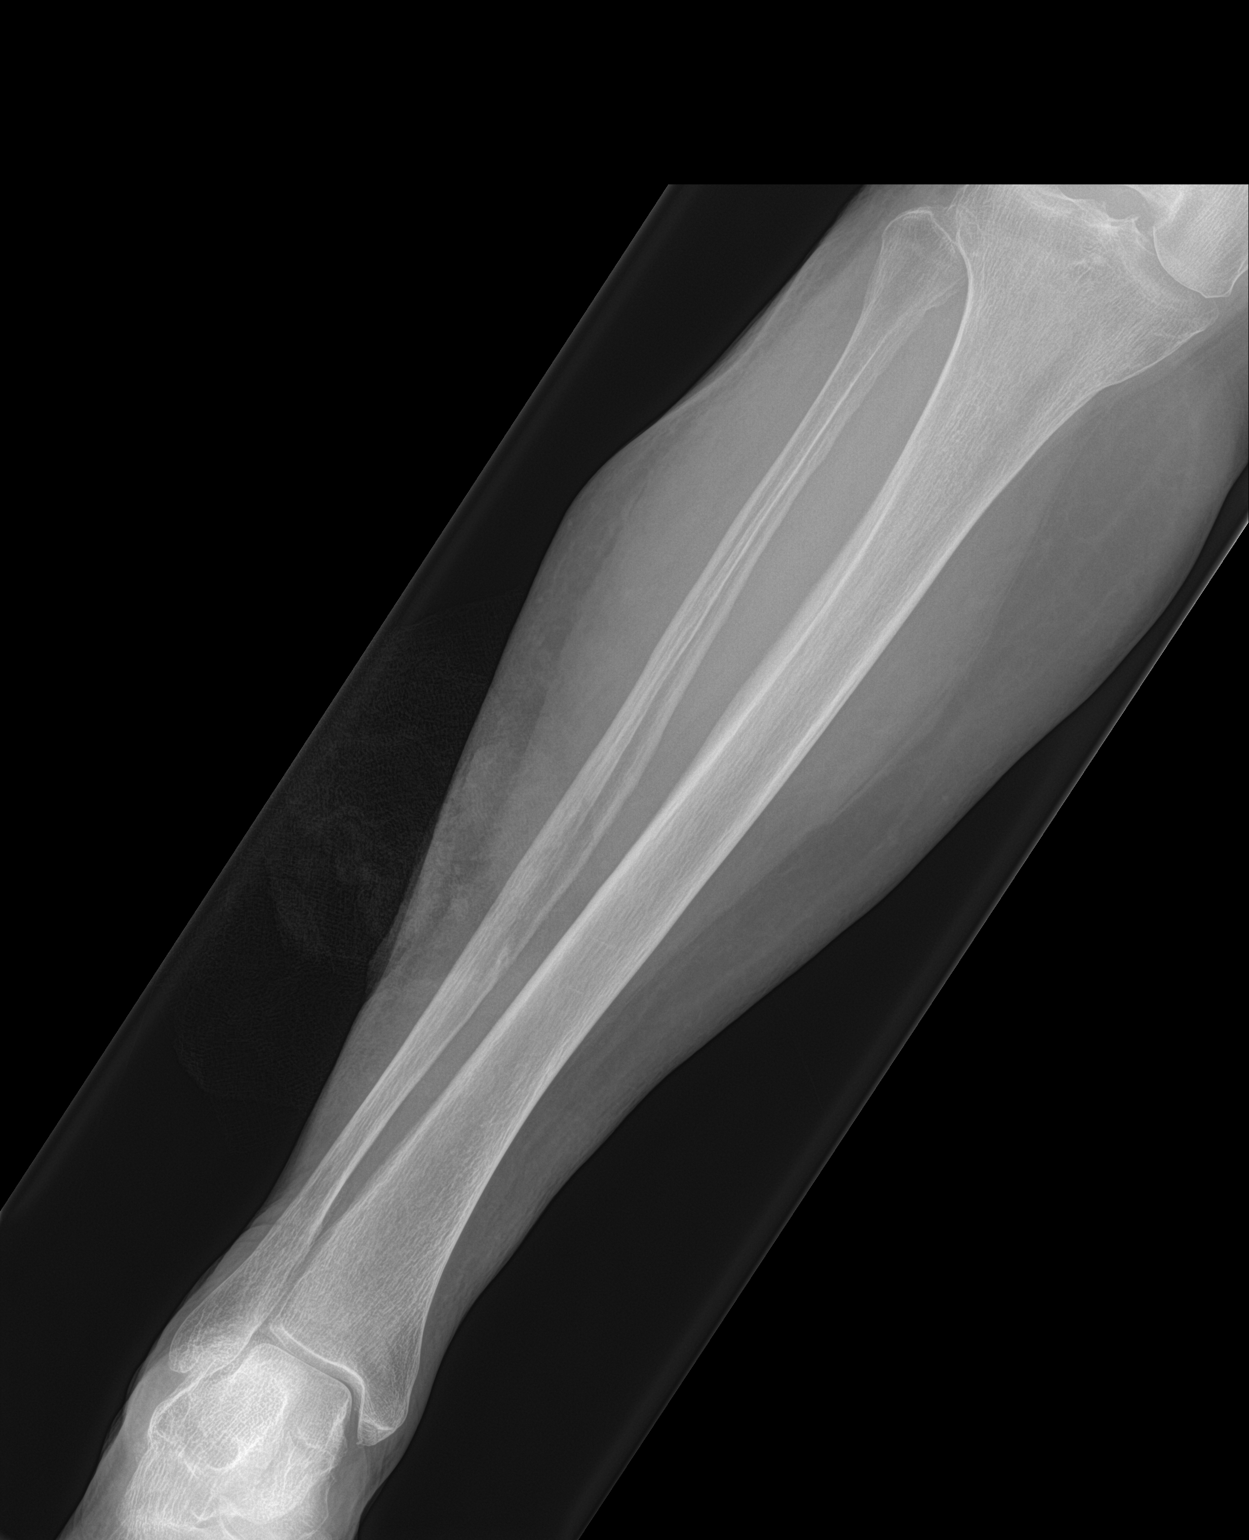

[tibia ap (2 of 2)]
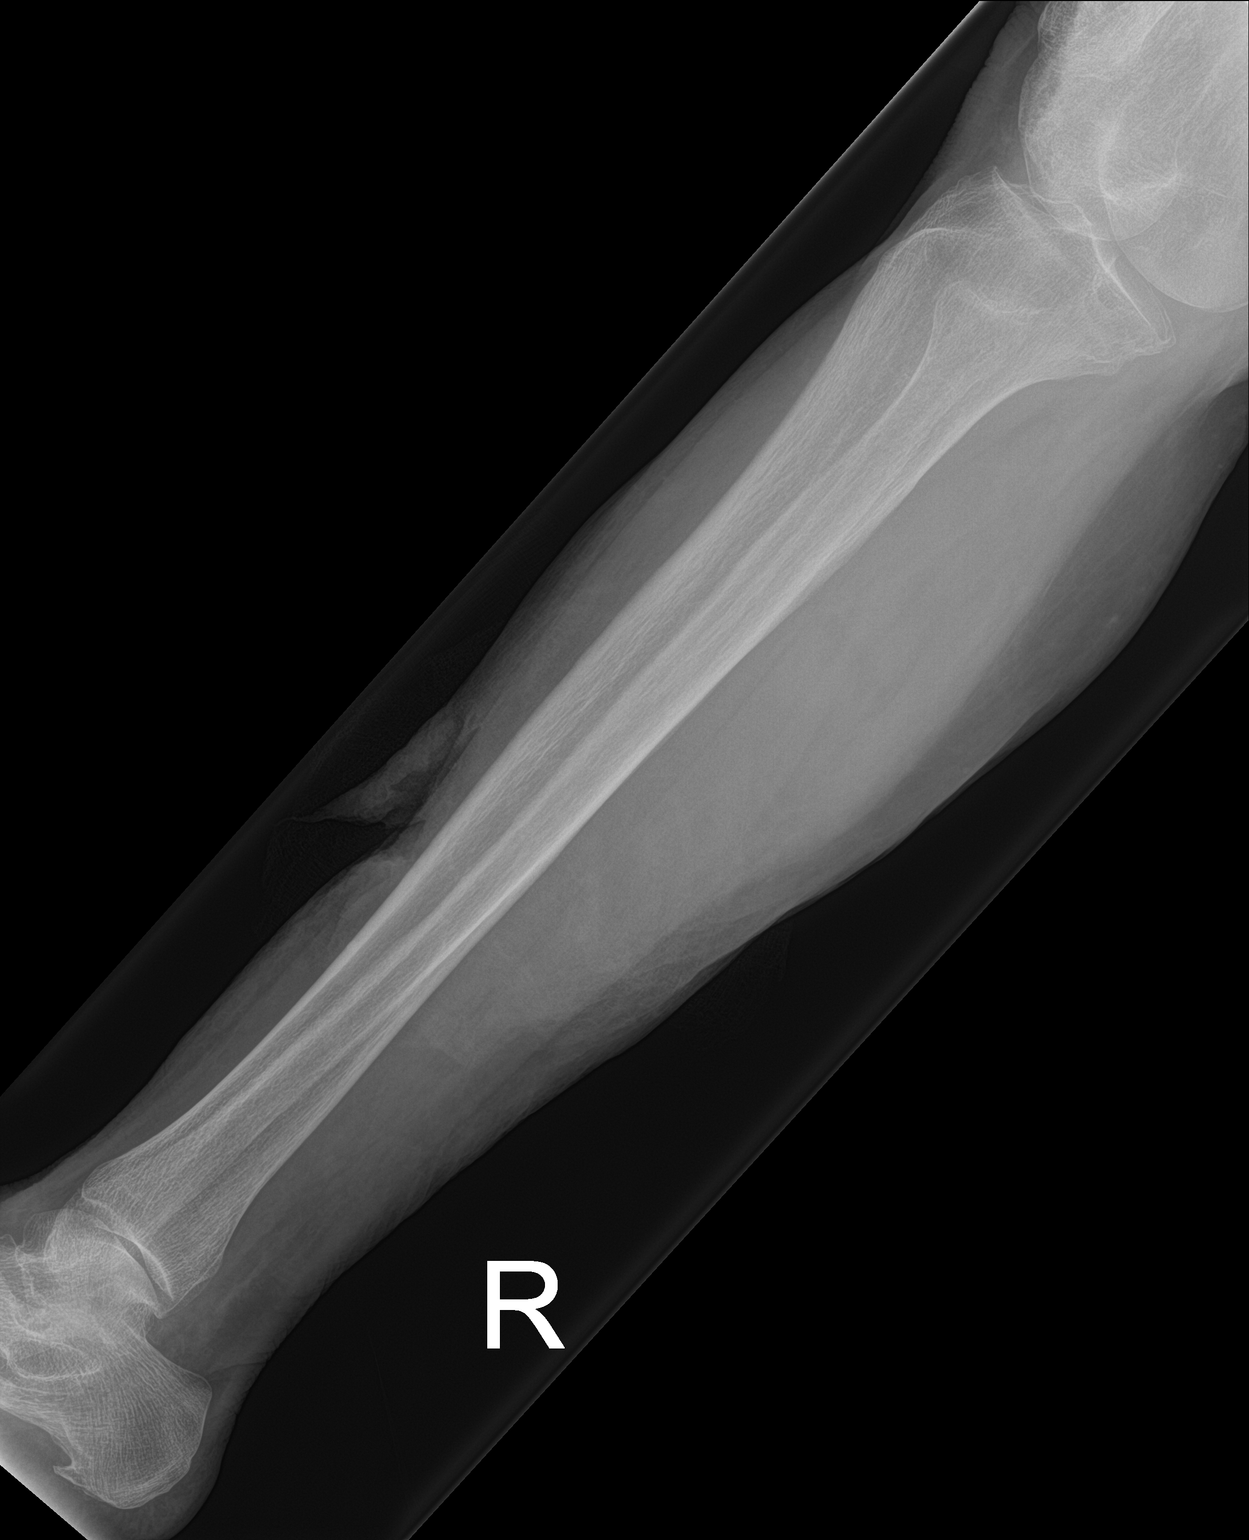

[2 of 2 positions shown; findings below may reference images not displayed]

FINDINGS: No fracture or malalignment. Large soft tissue laceration at the mid
to distal lower leg without radiopaque foreign body.
IMPRESSION: No acute osseous abnormality.

## 2022-11-17 ENCOUNTER — Emergency Department (HOSPITAL_BASED_OUTPATIENT_CLINIC_OR_DEPARTMENT_OTHER): Payer: Medicare Other

## 2022-11-17 ENCOUNTER — Encounter (HOSPITAL_BASED_OUTPATIENT_CLINIC_OR_DEPARTMENT_OTHER): Payer: Self-pay | Admitting: *Deleted

## 2022-11-17 ENCOUNTER — Emergency Department (HOSPITAL_COMMUNITY): Payer: Medicare Other

## 2022-11-17 ENCOUNTER — Emergency Department (HOSPITAL_BASED_OUTPATIENT_CLINIC_OR_DEPARTMENT_OTHER)
Admission: EM | Admit: 2022-11-17 | Discharge: 2022-11-18 | Disposition: A | Discharge status: Home / Self Care | Payer: Medicare Other | Source: Home / Self Care | Attending: Emergency Medicine | Admitting: Emergency Medicine

## 2022-11-17 ENCOUNTER — Other Ambulatory Visit: Payer: Self-pay

## 2022-11-17 DIAGNOSIS — R531 Weakness: Secondary | ICD-10-CM

## 2022-11-17 DIAGNOSIS — G20A1 Parkinson's disease without dyskinesia, without mention of fluctuations: Secondary | ICD-10-CM | POA: Diagnosis not present

## 2022-11-17 DIAGNOSIS — M4712 Other spondylosis with myelopathy, cervical region: Secondary | ICD-10-CM

## 2022-11-17 LAB — CBC WITH DIFFERENTIAL/PLATELET
Abs Immature Granulocytes: 0.03 10*3/uL (ref 0.00–0.07)
Basophils Absolute: 0 10*3/uL (ref 0.0–0.1)
Basophils Relative: 0 %
Eosinophils Absolute: 0.1 10*3/uL (ref 0.0–0.5)
Eosinophils Relative: 2 %
HCT: 37.5 % (ref 36.0–46.0)
Hemoglobin: 12.3 g/dL (ref 12.0–15.0)
Immature Granulocytes: 0 %
Lymphocytes Relative: 17 %
Lymphs Abs: 1.2 10*3/uL (ref 0.7–4.0)
MCH: 32.6 pg (ref 26.0–34.0)
MCHC: 32.8 g/dL (ref 30.0–36.0)
MCV: 99.5 fL (ref 80.0–100.0)
Monocytes Absolute: 0.6 10*3/uL (ref 0.1–1.0)
Monocytes Relative: 9 %
Neutro Abs: 5.1 10*3/uL (ref 1.7–7.7)
Neutrophils Relative %: 72 %
Platelets: 221 10*3/uL (ref 150–400)
RBC: 3.77 MIL/uL — ABNORMAL LOW (ref 3.87–5.11)
RDW: 11.4 % — ABNORMAL LOW (ref 11.5–15.5)
WBC: 7.2 10*3/uL (ref 4.0–10.5)
nRBC: 0 % (ref 0.0–0.2)

## 2022-11-17 LAB — BASIC METABOLIC PANEL
Anion gap: 8 (ref 5–15)
BUN: 29 mg/dL — ABNORMAL HIGH (ref 8–23)
CO2: 28 mmol/L (ref 22–32)
Calcium: 9 mg/dL (ref 8.9–10.3)
Chloride: 105 mmol/L (ref 98–111)
Creatinine, Ser: 0.96 mg/dL (ref 0.44–1.00)
GFR, Estimated: >60 mL/min (ref >60)
Glucose, Bld: 99 mg/dL (ref 70–99)
Potassium: 4 mmol/L (ref 3.5–5.1)
Sodium: 141 mmol/L (ref 135–145)

## 2022-11-17 LAB — MAGNESIUM: Magnesium: 2.1 mg/dL (ref 1.7–2.4)

## 2022-11-17 LAB — TROPONIN I (HIGH SENSITIVITY): Troponin I (High Sensitivity): 3 ng/L (ref <18)

## 2022-11-17 MED ORDER — IOHEXOL 350 MG/ML SOLN
75.0000 mL | Freq: Once | INTRAVENOUS | Status: AC | PRN
  Administered 2022-11-17: 75 mL via INTRAVENOUS

## 2022-11-17 NOTE — ED Notes (Signed)
Fall Risk armband Fall risk sign on door

## 2022-11-17 NOTE — ED Notes (Signed)
To MRI

## 2022-11-17 NOTE — ED Notes (Signed)
Called [facility] for ED to ED transport to [facility] @21 :29

## 2022-11-17 NOTE — ED Notes (Signed)
Consult called @19 :54

## 2022-11-17 NOTE — ED Provider Notes (Signed)
[employer] [facility]ERG[facility] DEPARTMENT AT [facility] Provider Note  CSN: [id number] Arrival date & time: 11/17/22 1542  Chief Complaint(s) Weakness  HPI Christina Pratt is a 72 y.o. female with past medical history as below, significant for parkinson's disease who presents to the ED with complaint of weakness. Pt reports around 2 wks ago she had near syncopal episode while transitioning from her wheelchair to bed, she does not believe she had syncope but reports that she was very close to "blacking out." Symptoms lasted for a few moments and then resolved. Not a/w chest pain, nausea, vomiting, palpitations or headache. This has not re-occurred. She also reports around 2 weeks of left arm weakness and left leg weakness. She reports that she did not seek care for this because she did not think it would help anything in regards to her health. She was seen at PT earlier today and sent to the ED For evaluation of the weakness to LUE LLE. She denies any numbness, no speech changes in last few days or vision changes. She is wheelchair bound. No fevers. No recent falls or head injuries. 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 [facility] Accompanied by spouse who is very hard of hearing   Past Medical History Past Medical History:  Diagnosis Date   Parkinson disease    There are no problems to display for this patient.  Home Medication(s) Prior to Admission medications   Medication Sig Start Date End Date Taking? Authorizing Provider  Carbidopa-Levodopa ER (SINEMET CR) 25-100 MG tablet controlled release Take by mouth. 05/02/19   [provider]  clonazePAM Scarlette Calico) 0.5 MG tablet Take by mouth.  08/02/19   [provider]  collagenase (SANTYL) ointment Apply 1 application topically daily. Patient not taking: Reported on 12/11/2019 09/01/19   Eldridge Abrahams, PA-C  venlafaxine XR (EFFEXOR-XR) 75 MG 24  hr capsule Take by mouth. 08/02/19   [provider]                                                                                                                                    Past Surgical History Past Surgical History:  Procedure Laterality Date   ABDOMINAL HYSTERECTOMY     BACK SURGERY     TONSILLECTOMY     Family History History reviewed. No pertinent family history.  Social History Social History   Tobacco Use   Smoking status: Never   Smokeless tobacco: Never  Substance Use Topics   Alcohol use: Never   Drug use: Never   Allergies Gabapentin and Tizanidine  Review of Systems Review of Systems  Constitutional:  Negative for chills and fever.  HENT:  Negative for facial swelling and trouble swallowing.   Eyes:  Negative for photophobia and visual disturbance.  Respiratory:  Negative for cough and shortness of breath.   Cardiovascular:  Negative for chest pain and palpitations.  Gastrointestinal:  Negative for abdominal pain, nausea and vomiting.  Endocrine: Negative for polydipsia and polyuria.  Genitourinary:  Negative for difficulty urinating and hematuria.  Musculoskeletal:  Negative for gait problem and joint swelling.  Skin:  Negative for pallor and rash.  Neurological:  Positive for weakness. Negative for speech difficulty and headaches.  Psychiatric/Behavioral:  Negative for agitation and confusion.     Physical Exam Vital Signs  I have reviewed the triage vital signs BP (!) 175/80   Pulse 74   Temp 98 F (36.7 C) (Oral)   Resp 13   SpO2 97%  Physical Exam Vitals and nursing note reviewed.  Constitutional:      General: She is not in acute distress.    Appearance: Normal appearance.  HENT:     Head: Normocephalic and atraumatic.     Right Ear: External ear normal.     Left Ear: External ear normal.     Nose: Nose normal.     Mouth/Throat:     Mouth: Mucous membranes are moist.  Eyes:     General: No scleral icterus.        Right eye: No discharge.        Left eye: No discharge.     Extraocular Movements: Extraocular movements intact.     Pupils: Pupils are equal, round, and reactive to light.  Cardiovascular:     Rate and Rhythm: Normal rate and regular rhythm.     Pulses: Normal pulses.     Heart sounds: Normal heart sounds.  Pulmonary:     Effort: Pulmonary effort is normal. No respiratory distress.     Breath sounds: Normal breath sounds. No stridor.  Abdominal:     General: Abdomen is flat. There is no distension.     Palpations: Abdomen is soft.     Tenderness: There is no abdominal tenderness.  Musculoskeletal:     Cervical back: No rigidity.     Right lower leg: No edema.     Left lower leg: No edema.  Skin:    General: Skin is warm and dry.     Capillary Refill: Capillary refill takes less than 2 seconds.       Neurological:     Mental Status: She is alert and oriented to person, place, and time.     GCS: GCS eye subscore is 4. GCS verbal subscore is 5. GCS motor subscore is 6.     Cranial Nerves: Cranial nerves 2-12 are intact. No dysarthria or facial asymmetry.     Sensory: Sensation is intact. No sensory deficit.     Motor: Pronator drift (LEFT) present. No weakness.     Coordination: Finger-Nose-Finger Test abnormal.     Comments: Abnormal FTN LUE  Gait testing deferred 2/2 pt safety (wheelchair bound) Strength 2/5 LUE LLE Strength 5/5 RUE RLE  Psychiatric:        Mood and Affect: Mood normal.        Behavior: Behavior normal. Behavior is cooperative.     ED Results and Treatments Labs (all labs ordered are listed, but only abnormal results are displayed) Labs Reviewed  CBC WITH DIFFERENTIAL/PLATELET - Abnormal; Notable for the following components:      Result Value   RBC 3.77 (*)    RDW 11.4 (*)    All other components within normal limits  BASIC METABOLIC PANEL - Abnormal; Notable for the following components:   BUN 29 (*)    All  other components within normal limits   MAGNESIUM  TROPONIN I (HIGH SENSITIVITY)                                                                                                                          Radiology CT VENOGRAM HEAD  Result Date: 11/17/2022 CLINICAL DATA:  Left-sided weakness and abnormal head CT EXAM: CT VENOGRAM HEAD TECHNIQUE: Venographic phase images of the brain were obtained following the administration of intravenous contrast. Multiplanar reformats and maximum intensity projections were generated. RADIATION DOSE REDUCTION: This exam was performed according to the departmental dose-optimization program which includes automated exposure control, adjustment of the mA and/or kV according to patient size and/or use of iterative reconstruction technique. CONTRAST:  75mL OMNIPAQUE IOHEXOL 350 MG/ML SOLN COMPARISON:  None Available. FINDINGS: Superior sagittal sinus: Normal. Straight sinus: Normal. Inferior sagittal sinus, vein of Galen and internal cerebral veins: Normal. Transverse sinuses: Normal. Sigmoid sinuses: Normal. Visualized jugular veins: Normal. IMPRESSION: No evidence of dural venous sinus thrombosis. The right sigmoid sinus enhances normally in the venous phase. Electronically Signed   By: Deatra Robinson M.D.   On: 11/17/2022 21:11   CT ANGIO HEAD NECK W WO CM  Result Date: 11/17/2022 CLINICAL DATA:  Neuro deficit, acute, stroke suspected LUE LLE weakness, +drift, hx parkinsons EXAM: CT ANGIOGRAPHY HEAD AND NECK WITH AND WITHOUT CONTRAST TECHNIQUE: Multidetector CT imaging of the head and neck was performed using the standard protocol during bolus administration of intravenous contrast. Multiplanar CT image reconstructions and MIPs were obtained to evaluate the vascular anatomy. Carotid stenosis measurements (when applicable) are obtained utilizing NASCET criteria, using the distal internal carotid diameter as the denominator. RADIATION DOSE REDUCTION: This exam was performed according to the departmental  dose-optimization program which includes automated exposure control, adjustment of the mA and/or kV according to patient size and/or use of iterative reconstruction technique. CONTRAST:  75mL OMNIPAQUE IOHEXOL 350 MG/ML SOLN COMPARISON:  None Available. FINDINGS: CT HEAD FINDINGS Brain: No evidence of acute infarction, hemorrhage, hydrocephalus, extra-axial collection or mass lesion/mass effect. Sequela of mild chronic microvascular ischemic change. Mineralization of the basal ganglia bilaterally. Vascular: No hyperdense vessel or unexpected calcification. Skull: Normal. Negative for fracture or focal lesion. Sinuses/Orbits: No middle ear mastoid effusion. Paranasal sinuses are clear. Orbits are unremarkable. Other: None. Review of the MIP images confirms the above findings CTA NECK FINDINGS Aortic arch: Standard branching. Imaged portion shows no evidence of aneurysm or dissection. No significant stenosis of the major arch vessel origins. Right carotid system: No evidence of dissection, stenosis (50% or greater), or occlusion. Left carotid system: No evidence of dissection, stenosis (50% or greater), or occlusion. Vertebral arteries: Codominant. No evidence of dissection, stenosis (50% or greater), or occlusion. Skeleton: Rightward curvature of the cervical spine. Grade 1 anterolisthesis of C3 on C4. Other neck: Negative. Upper chest: Negative. Review of the MIP images confirms the above findings CTA HEAD FINDINGS Anterior circulation: No significant stenosis, proximal occlusion, aneurysm, or vascular malformation. Posterior  circulation: No significant stenosis, proximal occlusion, aneurysm, or vascular malformation. Venous sinuses: Asymmetrically decreased opacification of the right sigmoid sinus, which has a somewhat hyperdense appearance on the same-day CT. Findings are suspicious for dural venous sinus thrombosis. Anatomic variants: None Review of the MIP images confirms the above findings IMPRESSION: 1.  Asymmetrically decreased opacification of the right sigmoid sinus, which has a somewhat hyperdense appearance on the same-day CT. Findings are suspicious for dural venous sinus thrombosis. Recommend further evaluation with a dedicated MRI and MRV of the head with and without contrast. 2. No intracranial large vessel occlusion. No hemodynamically significant stenosis in the neck. Electronically Signed   By: Lorenza Cambridge M.D.   On: 11/17/2022 19:01   DG Chest Portable 1 View  Result Date: 11/17/2022 CLINICAL DATA:  Syncope. EXAM: PORTABLE CHEST 1 VIEW COMPARISON:  None Available. FINDINGS: The heart size and mediastinal contours are within normal limits. Both lungs are clear. The visualized skeletal structures are unremarkable. IMPRESSION: No active disease. Electronically Signed   By: Lupita Raider M.D.   On: 11/17/2022 18:38    Pertinent labs & imaging results that were available during my care of the patient were reviewed by me and considered in my medical decision making (see MDM for details).  Medications Ordered in ED Medications  iohexol (OMNIPAQUE) 350 MG/ML injection 75 mL (75 mLs Intravenous Contrast Given 11/17/22 1803)  iohexol (OMNIPAQUE) 350 MG/ML injection 75 mL (75 mLs Intravenous Contrast Given 11/17/22 2030)                                                                                                                                     Procedures Procedures  (including critical care time)  Medical Decision Making / ED Course    Medical Decision Making:    Christina Pratt is a 72 y.o. female with past medical history as below, significant for parkinson's disease who presents to the ED with complaint of weakness. . The complaint involves an extensive differential diagnosis and also carries with it a high risk of complications and morbidity.  Serious etiology was considered. Ddx includes but is not limited to: cva, infectious, metabolic, medication effect, progression of  parkinson's disease, etc  Complete initial physical exam performed, notably the patient  was nad, left sided focal deficit noted.    Reviewed and confirmed nursing documentation for past medical history, family history, social history.  Vital signs reviewed.    Clinical Course as of 11/17/22 2131  Tue Nov 17, 2022  5409 CTA head neck w/ ?DVST per radiology. She does not have a headache but does have a focal exam (left sided weakness), will d/w neurology.  [SG]  1943 Spoke w/ Dr Wilford Corner, will review images [SG]  1947 Neuro recommending CTV, re-engage neuro after. Symptoms perhaps 2/2 her cervical dystonia  [SG]  2123 CT venogram was negative, will send to [facility] for [facility] head/c-spine.  [SG]  Clinical Course User Index [SG] Sloan Leiter, DO    Narrative: Pt with left sided weakness x2 wks Workup here is stable CTA with ?DVST Focal exam D/w neurology  CT venogram wnl Ongoing deficits Will send to [facility] for MRI brain/MRI C-spine Neurology team updated Recommend re-engage neurology if imaging reveals abnormality Outside of window for TNK or thrombectomy (LKN >2wks ago) O/w can likely be discharged home  Send to [facility] ED to ED Dr Theresia Lo accepting               Additional history obtained: -Additional history obtained from spouse -External records from outside source obtained and reviewed including: Chart review including previous notes, labs, imaging, consultation notes including  primary care documentation Prior labs/imaging   Lab Tests: -I ordered, reviewed, and interpreted labs.   The pertinent results include:   Labs Reviewed  CBC WITH DIFFERENTIAL/PLATELET - Abnormal; Notable for the following components:      Result Value   RBC 3.77 (*)    RDW 11.4 (*)    All other components within normal limits  BASIC METABOLIC PANEL - Abnormal; Notable for the following components:   BUN 29 (*)    All other components within normal limits  MAGNESIUM  TROPONIN I (HIGH  SENSITIVITY)    Notable for labs stable  EKG   EKG Interpretation Date/Time:  Tuesday November 17 2022 15:56:34 EDT Ventricular Rate:  67 PR Interval:  193 QRS Duration:  103 QT Interval:  404 QTC Calculation: 427 R Axis:   -30  Text Interpretation: Sinus or ectopic atrial rhythm Low voltage, precordial leads Left ventricular hypertrophy Anterior Q waves, possibly due to LVH unchanged for ekg of same day no stemi Confirmed by Tanda Rockers ([id number]) on 11/17/2022 3:59:10 PM         Imaging Studies ordered: I ordered imaging studies including CTA head/neck CTV I independently visualized the following imaging with scope of interpretation limited to determining acute life threatening conditions related to emergency care; findings noted above, significant for stable imaging I independently visualized and interpreted imaging. I agree with the radiologist interpretation   Medicines ordered and prescription drug management: Meds ordered this encounter  Medications   iohexol (OMNIPAQUE) 350 MG/ML injection 75 mL   iohexol (OMNIPAQUE) 350 MG/ML injection 75 mL    -I have reviewed the patients home medicines and have made adjustments as needed   Consultations Obtained: I requested consultation with the neurology,  and discussed lab and imaging findings as well as pertinent plan - they recommend: CTV/MRI   Cardiac Monitoring: The patient was maintained on a cardiac monitor.  I personally viewed and interpreted the cardiac monitored which showed an underlying rhythm of: NSR  Social Determinants of Health:  Diagnosis or treatment significantly limited by social determinants of health: non smoker   Reevaluation: After the interventions noted above, I reevaluated the patient and found that they have stayed the same  Co morbidities that complicate the patient evaluation  Past Medical History:  Diagnosis Date   Parkinson disease       Dispostion: Disposition decision including need  for hospitalization was considered, and patient transferred.    Final Clinical Impression(s) / ED Diagnoses Final diagnoses:  Left-sided weakness        Sloan Leiter, DO 11/17/22 2131

## 2022-11-17 NOTE — ED Notes (Signed)
Pt taken to CT.

## 2022-11-17 NOTE — Plan of Care (Signed)
On-call neurologist note  Called by Dr. Wallace Cullens regarding this patient who has a past history of cervical dystonia, Parkinson's with multiple weeks of left-sided weakness to the point where she is unable to bear weight.  CT angiogram with concern for possible asymmetric opacification of dural venous sinuses with further recommendations for advanced imaging.  At this time, I would recommend first obtaining a CT venogram to rule out a dural venous sinus thrombus followed by MRI of the brain and C-spine to better assist localize the issue.  She is outside the window for any emergent stroke intervention.  If there is concern for stroke on brain MRI, please consult inpatient neurology formally for a consultation.  If the issue is cervical spine compression, consult neurosurgery.  -- Milon Dikes, MD Neurologist [facility] Pager: [phone]

## 2022-11-17 NOTE — ED Provider Notes (Signed)
Assumed care at 2300. Patient transfer from [facility] for MR brain and C spine.   Patient tells me that she has had some left-sided weakness as she thinks ongoing for couple weeks.  MRI of the brain here without acute stroke.  MRI of the C-spine with some concern for cord compression C2-3 and C3-4 will discuss with neurosurgery.  I discussed case with Dr. Conchita Paris, neurosurgery recommended calling the office today to set up a urgent appointment.  He did not feel that she would benefit from admission or steroids.   Melene Plan, DO 11/18/22 [phone]

## 2022-11-17 NOTE — ED Triage Notes (Signed)
Pt to ED via [facility] from [facility]. Pt is wheelchair bound at home.

## 2022-11-17 NOTE — ED Notes (Signed)
Family at bedside. 

## 2022-11-17 NOTE — ED Notes (Signed)
With MRI

## 2022-11-17 NOTE — ED Triage Notes (Addendum)
BIB husband from home for weakness. Instructed to come to ED by her PTs for new and worsening sx. Endorses weakness for ~3 weeks. PT note reports L arm useless, has lost ability to stand, transfer and ambulate. Multiple recent falls. Seen in PT in part for Parkinsons. Into triage in w/c. Wearing soft cervical collar. A&O, calm, NAD, interactive. Denies acute pain, has only chronic pain. Last fall 2 weeks ago. H/o Severe cervical kyphosis (present), and BLE neuropathy.   Bufford Lope, PT, and Angelena Sole, [place] [phone]

## 2022-11-18 NOTE — ED Notes (Signed)
Back from MRI.

## 2022-11-18 NOTE — Discharge Instructions (Signed)
Please follow-up with the neurosurgeon in the office.  He went to call the office today and they would likely see you later this week.

## 2023-03-14 DEATH — deceased
